# Patient Record
Sex: Female | Born: 1985 | Hispanic: Yes | Marital: Married | Smoking: Never smoker
Health system: Southern US, Community
[De-identification: ages and names within clinical notes are randomized; demographics above are authoritative.]

## PROBLEM LIST (undated history)

## (undated) ENCOUNTER — Emergency Department: Payer: BC Managed Care – PPO

## (undated) DIAGNOSIS — K900 Celiac disease: Secondary | ICD-10-CM

## (undated) DIAGNOSIS — R51 Headache: Secondary | ICD-10-CM

## (undated) DIAGNOSIS — K219 Gastro-esophageal reflux disease without esophagitis: Secondary | ICD-10-CM

## (undated) DIAGNOSIS — R519 Headache, unspecified: Secondary | ICD-10-CM

## (undated) DIAGNOSIS — M1990 Unspecified osteoarthritis, unspecified site: Secondary | ICD-10-CM

## (undated) DIAGNOSIS — H30132 Disseminated chorioretinal inflammation, generalized, left eye: Secondary | ICD-10-CM

## (undated) DIAGNOSIS — Z3041 Encounter for surveillance of contraceptive pills: Secondary | ICD-10-CM

## (undated) HISTORY — DX: Encounter for surveillance of contraceptive pills: Z30.41

## (undated) HISTORY — PX: WISDOM TOOTH EXTRACTION: SHX21

## (undated) HISTORY — DX: Gastro-esophageal reflux disease without esophagitis: K21.9

## (undated) HISTORY — DX: Unspecified osteoarthritis, unspecified site: M19.90

## (undated) HISTORY — DX: Headache, unspecified: R51.9

## (undated) HISTORY — DX: Headache: R51

## (undated) HISTORY — PX: RETINAL DETACHMENT SURGERY: SHX105

## (undated) HISTORY — PX: CATARACT EXTRACTION: SUR2

---

## 2008-10-03 ENCOUNTER — Emergency Department (HOSPITAL_COMMUNITY): Admission: EM | Admit: 2008-10-03 | Discharge: 2008-10-03 | Payer: Self-pay | Admitting: Emergency Medicine

## 2010-05-11 LAB — URINALYSIS, ROUTINE W REFLEX MICROSCOPIC
Hgb urine dipstick: NEGATIVE
Nitrite: NEGATIVE
Protein, ur: NEGATIVE mg/dL
Specific Gravity, Urine: 1.006 (ref 1.005–1.030)
Urobilinogen, UA: 0.2 mg/dL (ref 0.0–1.0)

## 2010-05-11 LAB — POCT PREGNANCY, URINE: Preg Test, Ur: NEGATIVE

## 2011-09-19 DIAGNOSIS — H30132 Disseminated chorioretinal inflammation, generalized, left eye: Secondary | ICD-10-CM | POA: Insufficient documentation

## 2011-09-19 DIAGNOSIS — H30039 Focal chorioretinal inflammation, peripheral, unspecified eye: Secondary | ICD-10-CM | POA: Insufficient documentation

## 2011-09-19 DIAGNOSIS — M0800 Unspecified juvenile rheumatoid arthritis of unspecified site: Secondary | ICD-10-CM | POA: Insufficient documentation

## 2015-06-27 DIAGNOSIS — Z5181 Encounter for therapeutic drug level monitoring: Secondary | ICD-10-CM | POA: Insufficient documentation

## 2016-07-02 DIAGNOSIS — N946 Dysmenorrhea, unspecified: Secondary | ICD-10-CM | POA: Insufficient documentation

## 2016-07-02 LAB — HM PAP SMEAR

## 2017-07-15 DIAGNOSIS — Z8669 Personal history of other diseases of the nervous system and sense organs: Secondary | ICD-10-CM | POA: Insufficient documentation

## 2018-01-06 ENCOUNTER — Ambulatory Visit: Payer: BC Managed Care – PPO | Admitting: Family Medicine

## 2018-01-06 ENCOUNTER — Ambulatory Visit (INDEPENDENT_AMBULATORY_CARE_PROVIDER_SITE_OTHER): Payer: BC Managed Care – PPO

## 2018-01-06 ENCOUNTER — Other Ambulatory Visit: Payer: Self-pay

## 2018-01-06 ENCOUNTER — Encounter: Payer: Self-pay | Admitting: Family Medicine

## 2018-01-06 VITALS — BP 110/72 | HR 76 | Temp 98.4°F | Ht 63.0 in | Wt 135.2 lb

## 2018-01-06 DIAGNOSIS — Z3041 Encounter for surveillance of contraceptive pills: Secondary | ICD-10-CM

## 2018-01-06 DIAGNOSIS — M79672 Pain in left foot: Secondary | ICD-10-CM | POA: Diagnosis not present

## 2018-01-06 DIAGNOSIS — M0800 Unspecified juvenile rheumatoid arthritis of unspecified site: Secondary | ICD-10-CM

## 2018-01-06 DIAGNOSIS — Z0000 Encounter for general adult medical examination without abnormal findings: Secondary | ICD-10-CM | POA: Diagnosis not present

## 2018-01-06 DIAGNOSIS — N946 Dysmenorrhea, unspecified: Secondary | ICD-10-CM | POA: Diagnosis not present

## 2018-01-06 DIAGNOSIS — H5440 Blindness, one eye, unspecified eye: Secondary | ICD-10-CM

## 2018-01-06 DIAGNOSIS — K9041 Non-celiac gluten sensitivity: Secondary | ICD-10-CM | POA: Insufficient documentation

## 2018-01-06 HISTORY — DX: Encounter for surveillance of contraceptive pills: Z30.41

## 2018-01-06 LAB — CBC WITH DIFFERENTIAL/PLATELET
Basophils Absolute: 0 10*3/uL (ref 0.0–0.1)
Basophils Relative: 0.6 % (ref 0.0–3.0)
EOS ABS: 0.3 10*3/uL (ref 0.0–0.7)
Eosinophils Relative: 6.5 % — ABNORMAL HIGH (ref 0.0–5.0)
HCT: 40.5 % (ref 36.0–46.0)
Hemoglobin: 13.9 g/dL (ref 12.0–15.0)
LYMPHS ABS: 1.6 10*3/uL (ref 0.7–4.0)
Lymphocytes Relative: 33.3 % (ref 12.0–46.0)
MCHC: 34.4 g/dL (ref 30.0–36.0)
MCV: 88.9 fl (ref 78.0–100.0)
MONO ABS: 0.3 10*3/uL (ref 0.1–1.0)
Monocytes Relative: 5.7 % (ref 3.0–12.0)
NEUTROS ABS: 2.5 10*3/uL (ref 1.4–7.7)
NEUTROS PCT: 53.9 % (ref 43.0–77.0)
PLATELETS: 198 10*3/uL (ref 150.0–400.0)
RBC: 4.55 Mil/uL (ref 3.87–5.11)
RDW: 12.8 % (ref 11.5–15.5)
WBC: 4.7 10*3/uL (ref 4.0–10.5)

## 2018-01-06 LAB — COMPREHENSIVE METABOLIC PANEL
ALT: 15 U/L (ref 0–35)
AST: 17 U/L (ref 0–37)
Albumin: 4.3 g/dL (ref 3.5–5.2)
Alkaline Phosphatase: 33 U/L — ABNORMAL LOW (ref 39–117)
BILIRUBIN TOTAL: 0.3 mg/dL (ref 0.2–1.2)
BUN: 8 mg/dL (ref 6–23)
CHLORIDE: 105 meq/L (ref 96–112)
CO2: 24 meq/L (ref 19–32)
CREATININE: 0.79 mg/dL (ref 0.40–1.20)
Calcium: 8.8 mg/dL (ref 8.4–10.5)
GFR: 88.92 mL/min (ref >60.00)
GLUCOSE: 89 mg/dL (ref 70–99)
Potassium: 3.5 mEq/L (ref 3.5–5.1)
Sodium: 138 mEq/L (ref 135–145)
Total Protein: 7.3 g/dL (ref 6.0–8.3)

## 2018-01-06 LAB — LIPID PANEL
CHOL/HDL RATIO: 4
Cholesterol: 213 mg/dL — ABNORMAL HIGH (ref 0–200)
HDL: 47.5 mg/dL (ref >39.00)
LDL CALC: 134 mg/dL — AB (ref 0–99)
NONHDL: 165.01
Triglycerides: 155 mg/dL — ABNORMAL HIGH (ref 0.0–149.0)
VLDL: 31 mg/dL (ref 0.0–40.0)

## 2018-01-06 NOTE — Patient Instructions (Signed)
Please return in 12 months for your annual complete physical; please come fasting.  Please go to our [facility] office to get your xrays done. You can walk in M-F between 8am and 5pm. Tell them you are there for xrays ordered by me. They will send me the results, then I will let you know the results with instructions.   Address: [address] [place], [place], [place] [phone]  (office sits at [address] at [address] intersection; from here, turn left onto [place] [address]), take to [address], turn right and go for a mile or so, office will be on left across form [address] )   It was a pleasure meeting you today! Thank you for choosing [place] to meet your healthcare needs! I truly look forward to working with you. If you have any questions or concerns, please send me a message via Mychart or call the office at [phone].  Please do these things to maintain good health!   Exercise at least 30-45 minutes a day,  4-5 days a week.   Eat a low-fat diet with lots of fruits and vegetables, up to 7-9 servings per day.  Drink plenty of water daily. Try to drink 8 8oz glasses per day.  Seatbelts can save your life. Always wear your seatbelt.  Place Smoke Detectors on every level of your home and check batteries every year.  Schedule an appointment with an eye doctor for an eye exam every 1-2 years  Safe sex - use condoms to protect yourself from STDs if you could be exposed to these types of infections. Use birth control if you do not want to become pregnant and are sexually active.  Avoid heavy alcohol use. If you drink, keep it to less than 2 drinks/day and not every day.  Health Care Power of Attorney.  Choose someone you trust that could speak for you if you became unable to speak for yourself.  Depression is common in our stressful world.If you're feeling down or losing interest in things you normally enjoy, please come in for a  visit.  If anyone is threatening or hurting you, please get help. Physical or Emotional Violence is never OK.

## 2018-01-06 NOTE — Progress Notes (Signed)
Subjective  Chief Complaint  Patient presents with  . Establish Care    Never had a PCP, Noelle for GYN, last visit was 07/17/2017  . Annual Exam    no complaints. HM up to date, Patient is fasting today   . Toe Pain    left Great Toe Pain    HPI: Darlene Montoya is a 32 y.o. female who presents to [facility] today for a Female Wellness Visit.   Wellness Visit: annual visit with health maintenance review and exam without Pap   32 year old G0 happily married female presents for annual exam.  She is never had a prior primary care doctor before.  She was diagnosed with juvenile rheumatoid arthritis at age 33, although that diagnosis is common to question since most of her symptoms have resolved on a gluten-free diet.  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  She sees a [facility] ophthalmologist every 6 months to manage her retinal disease.  She thus is skeptical about the diagnosis and further rheumatologic care.  Currently she has no joint pain.  She reportedly was rheumatoid negative.  She denies ever having been told she has bone changes consistent with rheumatoid arthritis and x-rays.  Follows a gluten-free diet and feels well.  Strongly sensitive to gluten with abdominal cramping, diarrhea and joint pain.  She has never been tested for celiac disease that she would never want to retry gluten.  About a year ago jammed her left great toe on a concrete curb while at the beach.  Urgent care evaluation was negative for fracture.  However since she continued to have pain in that joint.  Almost daily pain but mild.  Worse with standing for prolonged period of time.  Does not take anything for pain.  Chronic dysmenorrhea managed well with oral contraceptives since age 89.  No need for birth control.  Health maintenance: Pap smears are  normal and up-to-date.  Reports recent HIV testing.  Lives healthy lifestyle.  Assessment  1. Annual physical exam   2. 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 Left foot pain   4. Dysmenorrhea   5. JRA (juvenile rheumatoid arthritis) (HCC)   6. Gluten-sensitive enteropathy   7. Oral contraceptive use      Plan  Female Wellness Visit:  Age appropriate Health Maintenance and Prevention measures were discussed with patient. Included topics are cancer screening recommendations, ways to keep healthy (see AVS) including dietary and exercise recommendations, regular eye and dental care, use of seat belts, and avoidance of moderate alcohol use and tobacco use.   BMI: discussed patient's BMI and encouraged positive lifestyle modifications to help get to or maintain a target BMI.  HM needs and immunizations were addressed and ordered. See below for orders. See HM and immunization section for updates.  Routine labs and screening tests ordered including cmp, cbc and lipids where appropriate.  Discussed recommendations regarding Vit D and calcium supplementation (see AVS)  Left toe pain: May be due to posttraumatic arthritis or bunion.  Check x-rays, start NSAIDs and/or Tylenol.  Follow-up if not improving.  Consider sports medicine for orthotics.  Likely with celiac disease.  Continue gluten-free diet.  History of possible juvenile rheumatoid arthritis: In the future would consider tertiary care center referral for further evaluation and review.  Although, currently she is  doing very well.  Follow up: No follow-ups on file.   Orders Placed This Encounter  Procedures  . DG Foot Complete Left  . HM PAP SMEAR  . CBC with Differential/Platelet  . Comprehensive metabolic panel  . Lipid panel   No orders of the defined types were placed in this encounter.     Lifestyle: Body mass index is 23.95 kg/m. Wt Readings from Last 3 Encounters:  01/06/18 135 lb 3.2 oz  (61.3 kg)   Diet: low fat Exercise: frequently,  Need for contraception: No,  Social: Married to same-sex partner for 3 years.  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  Non-smoker  Patient Active Problem List   Diagnosis Date Noted  . 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 Gluten-sensitive enteropathy 01/06/2018    Clinical diagnosis   . Oral contraceptive use 01/06/2018    For dysmenorrhea and history of ruptured ovarian cyst   . History of retinal detachment 07/15/2017  . Dysmenorrhea 07/02/2016  . Therapeutic drug monitoring 06/27/2015  . 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 JRA (juvenile rheumatoid arthritis) (HCC) 09/19/2011    DX'D AGE 11.5 YRS.  STOPPED MEDS IN 2012 WHEN BECAME GLUTEN-FREE.   Marland Kitchen Retinal necrosis, acute, left 09/19/2011    S/P RD REPAIR 08/2010 S/P VTX WITH SO 09/2010 S/P CE WITH PCIOL 03/2011 S/P SO REMOVAL 07/21/11 S/P SO 09/08/11    Health Maintenance  Topic Date Due  . HIV Screening  09/17/1999  . PAP SMEAR  07/02/2021  . TETANUS/TDAP  07/05/2027  . INFLUENZA VACCINE  Completed   Immunization History  Administered Date(s) Administered  . HPV Quadrivalent 04/09/2009, 06/11/2009  . Influenza,inj,Quad PF,6+ Mos 12/14/2017  . Influenza-Unspecified 11/27/2015, 11/14/2016   We updated and reviewed the patient's past history in detail and it is documented below. Allergies: Patient is allergic to gluten meal; ketorolac; ketorolac tromethamine; nickel; and naproxen. Past Medical History Patient  has a past medical history of Arthritis, Frequent headaches, GERD (gastroesophageal reflux disease), and Oral contraceptive use (01/06/2018). Past Surgical History Patient  has a past surgical history that includes Wisdom tooth extraction; Retinal detachment surgery; and Cataract extraction. Family History: Patient family history includes Alzheimer's disease in her  paternal grandfather and paternal grandmother; Anxiety disorder in her brother and mother; Depression in her maternal grandmother and paternal grandmother; Diabetes in her brother, maternal grandfather, and maternal grandmother; Heart disease in her maternal grandfather; Hyperlipidemia in her father; Hypertension in her father; Leukemia in her maternal grandmother; Stroke in her maternal grandfather. Social History:  Patient  reports that she has never smoked. She has never used smokeless tobacco. She reports that she drank alcohol. She reports that she does not use drugs.  Review of Systems: Constitutional: negative for fever or malaise Ophthalmic: negative for photophobia, double vision or loss of vision Cardiovascular: negative for chest pain, dyspnea on exertion, or new LE swelling Respiratory: negative for SOB or persistent cough Gastrointestinal: negative for abdominal pain, change in bowel habits or melena Genitourinary: negative for dysuria or gross hematuria, no abnormal uterine bleeding or disharge Musculoskeletal: negative for new gait disturbance or muscular weakness Integumentary: negative for new or persistent rashes, no breast lumps Neurological: negative for TIA or stroke symptoms Psychiatric: negative for SI or delusions Allergic/Immunologic: negative for hives Patient Care Team    Relationship Specialty Notifications Start End  Willow Ora, MD PCP - General Family Medicine  01/06/18  Luciano Cutter, MD Consulting Physician Obstetrics and Gynecology  01/06/18     Objective  Vitals: BP 110/72   Pulse 76   Temp 98.4 F (36.9 C)   Ht 5\' 3"  (1.6 m)   Wt 135 lb 3.2 oz (61.3 kg)   LMP 12/23/2017   SpO2 99%   BMI 23.95 kg/m  General:  Well developed, well nourished, no acute distress  Psych:  Alert and orientedx3,normal mood and affect HEENT:  Normocephalic, atraumatic, non-icteric sclera, PERRL, oropharynx is clear without mass or exudate, supple neck without  adenopathy, mass or thyromegaly Cardiovascular:  Normal S1, S2, RRR without gallop, rub or murmur, nondisplaced PMI Respiratory:  Good breath sounds bilaterally, CTAB with normal respiratory effort Gastrointestinal: normal bowel sounds, soft, non-tender, no noted masses. No HSM MSK: no deformities, contusions. Joints are without erythema or swelling. Spine and CVA region are nontender, left foot normal-appearing, possible small bunion at first MTP.  No tenderness.  No erythema or warmth Skin:  Warm, no rashes or suspicious lesions noted Neurologic:    Mental status is normal. CN 2-11 are normal. Gross motor and sensory exams are normal. Normal gait. No tremor   Commons side effects, risks, benefits, and alternatives for medications and treatment plan prescribed today were discussed, and the patient expressed understanding of the given instructions. Patient is instructed to call or message via MyChart if he/she has any questions or concerns regarding our treatment plan. No barriers to understanding were identified. We discussed Red Flag symptoms and signs in detail. Patient expressed understanding regarding what to do in case of urgent or emergency type symptoms.   Medication list was reconciled, printed and provided to the patient in AVS. Patient instructions and summary information was reviewed with the patient as documented in the AVS. This note was prepared with assistance of Dragon voice recognition software. Occasional wrong-word or sound-a-like substitutions may have occurred due to the inherent limitations of voice recognition software

## 2018-01-07 ENCOUNTER — Encounter: Payer: Self-pay | Admitting: Family Medicine

## 2018-01-07 LAB — HIV ANTIBODY (ROUTINE TESTING W REFLEX): HIV: NONREACTIVE

## 2018-01-07 NOTE — Progress Notes (Signed)
Please call patient: I have reviewed her xray result: it confirms arthritis in the first toe as expected. She may try the therapies we discussed yesterday to help: supportive shoes, tylenol or advil. F/u if needed

## 2018-07-27 ENCOUNTER — Encounter: Payer: Self-pay | Admitting: Family Medicine

## 2019-01-10 ENCOUNTER — Encounter: Payer: BC Managed Care – PPO | Admitting: Family Medicine

## 2019-02-19 ENCOUNTER — Encounter: Payer: Self-pay | Admitting: Family Medicine

## 2020-04-27 ENCOUNTER — Encounter (HOSPITAL_COMMUNITY): Payer: Self-pay | Admitting: Emergency Medicine

## 2020-04-27 ENCOUNTER — Emergency Department (HOSPITAL_COMMUNITY)
Admission: EM | Admit: 2020-04-27 | Discharge: 2020-04-27 | Disposition: A | Discharge status: Home / Self Care | Payer: BC Managed Care – PPO | Attending: Emergency Medicine | Admitting: Emergency Medicine

## 2020-04-27 ENCOUNTER — Other Ambulatory Visit: Payer: Self-pay

## 2020-04-27 ENCOUNTER — Emergency Department (HOSPITAL_COMMUNITY): Payer: BC Managed Care – PPO

## 2020-04-27 DIAGNOSIS — R0789 Other chest pain: Secondary | ICD-10-CM | POA: Diagnosis present

## 2020-04-27 DIAGNOSIS — R0602 Shortness of breath: Secondary | ICD-10-CM | POA: Diagnosis not present

## 2020-04-27 DIAGNOSIS — R079 Chest pain, unspecified: Secondary | ICD-10-CM

## 2020-04-27 HISTORY — DX: Celiac disease: K90.0

## 2020-04-27 HISTORY — DX: Disseminated chorioretinal inflammation, generalized, left eye: H30.132

## 2020-04-27 LAB — CBC
HCT: 41.4 % (ref 36.0–46.0)
Hemoglobin: 14 g/dL (ref 12.0–15.0)
MCH: 30.7 pg (ref 26.0–34.0)
MCHC: 33.8 g/dL (ref 30.0–36.0)
MCV: 90.8 fL (ref 80.0–100.0)
Platelets: 179 10*3/uL (ref 150–400)
RBC: 4.56 MIL/uL (ref 3.87–5.11)
RDW: 12.7 % (ref 11.5–15.5)
WBC: 6.2 10*3/uL (ref 4.0–10.5)
nRBC: 0 % (ref 0.0–0.2)

## 2020-04-27 LAB — BASIC METABOLIC PANEL
Anion gap: 7 (ref 5–15)
BUN: 13 mg/dL (ref 6–20)
CO2: 24 mmol/L (ref 22–32)
Calcium: 9 mg/dL (ref 8.9–10.3)
Chloride: 104 mmol/L (ref 98–111)
Creatinine, Ser: 0.86 mg/dL (ref 0.44–1.00)
GFR, Estimated: >60 mL/min (ref >60)
Glucose, Bld: 87 mg/dL (ref 70–99)
Potassium: 3.7 mmol/L (ref 3.5–5.1)
Sodium: 135 mmol/L (ref 135–145)

## 2020-04-27 LAB — TROPONIN I (HIGH SENSITIVITY): Troponin I (High Sensitivity): <2 ng/L (ref <18)

## 2020-04-27 LAB — D-DIMER, QUANTITATIVE: D-Dimer, Quant: 0.28 ug/mL-FEU (ref 0.00–0.50)

## 2020-04-27 LAB — I-STAT BETA HCG BLOOD, ED (MC, WL, AP ONLY): I-stat hCG, quantitative: <5 m[IU]/mL (ref <5)

## 2020-04-27 NOTE — ED Provider Notes (Signed)
[facility] EMERGENCY DEPARTMENT Provider Note   CSN: [id number] Arrival date & time: 04/27/20  1749     History Chief Complaint  Patient presents with  . Chest Pain    Darlene Montoya is a 35 y.o. female.  Presented to ER with concern for chest pain.  Over the past few days patient has noted some intermittent chest discomfort.  States it occurs worse with deep breathing, also noted when going up steps.  Has felt slightly more short of breath than normal as well.  Currently at rest does not have any symptoms.  Went to urgent care and told to go to ER for further evaluation.  States that she has family history of coronary artery disease however neither of her parents have CAD.  Takes OCPs for dysmenorrhea.  Non-smoker.  Celiac disease, diet controlled rheumatoid arthritis.  No leg pain or leg swelling.  HPI     Past Medical History:  Diagnosis Date  . Acute retinal necrosis of left eye   . Arthritis   . Celiac disease   . Frequent headaches   . GERD (gastroesophageal reflux disease)   . Oral contraceptive use 01/06/2018   For dysmenorrhea and history of ruptured ovarian cyst    Patient Active Problem List   Diagnosis Date Noted  . 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 Gluten-sensitive enteropathy 01/06/2018  . Oral contraceptive use 01/06/2018  . History of retinal detachment 07/15/2017  . Dysmenorrhea 07/02/2016  . Therapeutic drug monitoring 06/27/2015  . 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 JRA (juvenile rheumatoid arthritis) (HCC) 09/19/2011  . Retinal necrosis, acute, left 09/19/2011    Past Surgical History:  Procedure Laterality Date  . CATARACT EXTRACTION    . RETINAL DETACHMENT SURGERY    . WISDOM TOOTH EXTRACTION       OB History   No obstetric history on file.     Family History  Problem Relation Age of Onset  . Anxiety disorder Mother   . Hyperlipidemia Father   .  Hypertension Father   . Anxiety disorder Brother   . Diabetes Brother   . Diabetes Maternal Grandmother   . Leukemia Maternal Grandmother   . Depression Maternal Grandmother   . Diabetes Maternal Grandfather   . Heart disease Maternal Grandfather   . Stroke Maternal Grandfather   . Depression Paternal Grandmother   . Alzheimer's disease Paternal Grandmother   . Alzheimer's disease Paternal Grandfather     Social History   Tobacco Use  . Smoking status: Never Smoker  . Smokeless tobacco: Never Used  Vaping Use  . Vaping Use: Never used  Substance Use Topics  . Alcohol use: Not Currently  . Drug use: Never    Home Medications Prior to Admission medications   Medication Sig Start Date End Date Taking? Authorizing Provider  Multiple Vitamins-Minerals (MULTIVITAMIN ADULT PO) Take by mouth.    [provider]  norgestrel-ethinyl estradiol (LOW-OGESTREL) 0.3-30 MG-MCG tablet  05/29/16   [provider]  valACYclovir (VALTREX) 500 MG tablet Take by mouth. 04/28/16   [provider]    Allergies    Gluten meal, Ketorolac, Ketorolac tromethamine, Nickel, and Naproxen  Review of Systems   Review of Systems  Constitutional: Negative for chills and fever.  HENT: Negative for ear pain and sore throat.   Eyes: Negative for pain and visual disturbance.  Respiratory: Positive for shortness of breath. Negative for cough.   Cardiovascular: Positive  for chest pain. Negative for palpitations.  Gastrointestinal: Negative for abdominal pain and vomiting.  Genitourinary: Negative for dysuria and hematuria.  Musculoskeletal: Negative for arthralgias and back pain.  Skin: Negative for color change and rash.  Neurological: Negative for seizures and syncope.  All other systems reviewed and are negative.   Physical Exam Updated Vital Signs BP 118/75   Pulse 77   Temp 98.8 F (37.1 C)   Resp 10   LMP  (Within Weeks)   SpO2 99%   Physical Exam Vitals and  nursing note reviewed.  Constitutional:      General: She is not in acute distress.    Appearance: She is well-developed.  HENT:     Head: Normocephalic and atraumatic.  Eyes:     Conjunctiva/sclera: Conjunctivae normal.  Cardiovascular:     Rate and Rhythm: Normal rate and regular rhythm.     Heart sounds: No murmur heard.   Pulmonary:     Effort: Pulmonary effort is normal. No respiratory distress.     Breath sounds: Normal breath sounds.  Abdominal:     Palpations: Abdomen is soft.     Tenderness: There is no abdominal tenderness.  Musculoskeletal:     Cervical back: Neck supple.  Skin:    General: Skin is warm and dry.     Capillary Refill: Capillary refill takes less than 2 seconds.  Neurological:     General: No focal deficit present.     Mental Status: She is alert.  Psychiatric:        Mood and Affect: Mood normal.        Behavior: Behavior normal.     ED Results / Procedures / Treatments   Labs (all labs ordered are listed, but only abnormal results are displayed) Labs Reviewed  BASIC METABOLIC PANEL  CBC  D-DIMER, QUANTITATIVE  I-STAT BETA HCG BLOOD, ED (MC, WL, AP ONLY)  TROPONIN I (HIGH SENSITIVITY)  TROPONIN I (HIGH SENSITIVITY)    EKG EKG Interpretation  Date/Time:  Friday April 27 2020 17:56:15 EDT Ventricular Rate:  80 PR Interval:  130 QRS Duration: 82 QT Interval:  354 QTC Calculation: 408 R Axis:   91 Text Interpretation: Normal sinus rhythm Rightward axis Borderline ECG Confirmed by Marianna Fuss ([id number]) on 04/27/2020 6:50:55 PM   Radiology DG Chest 2 View  Result Date: 04/27/2020 CLINICAL DATA:  Chest pain, persistent midsternal chest pain for 3 days exaggerated by inspiration and exertion EXAM: CHEST - 2 VIEW COMPARISON:  None FINDINGS: Normal heart size, mediastinal contours, and pulmonary vascularity. Lungs clear. No pleural effusion or pneumothorax. Bones unremarkable. IMPRESSION: Normal exam. Electronically Signed   By: Ulyses Southward M.D.   On: 04/27/2020 18:23    Procedures Procedures   Medications Ordered in ED Medications - No data to display  ED Course  I have reviewed the triage vital signs and the nursing notes.  Pertinent labs & imaging results that were available during my care of the patient were reviewed by me and considered in my medical decision making (see chart for details).    MDM Rules/Calculators/A&P                         35 year old who presented to ER with concern for chest pain and intermittent shortness of breath.  On physical exam she is remarkably well-appearing no distress.  EKG without acute ischemic change and troponin undetectable, doubt ACS.  D-dimer within normal limits, vital stable, doubt PE.  CXR negative.  Given no ongoing symptoms and stable vital signs and reassuring work-up, believe patient can be discharged and managed in the outpatient setting.  Suspect more likely MSK in etiology.  Recommended follow-up with PCP.  After the discussed management above, the patient was determined to be safe for discharge.  The patient was in agreement with this plan and all questions regarding their care were answered.  ED return precautions were discussed and the patient will return to the ED with any significant worsening of condition.   Final Clinical Impression(s) / ED Diagnoses Final diagnoses:  Chest pain, unspecified type    Rx / DC Orders ED Discharge Orders    None       Milagros Loll, MD 04/27/20 2036

## 2020-04-27 NOTE — ED Triage Notes (Signed)
Patient complains of persistent mid sternal chest pain for the last three days that is exacerbated by inspiration and exertion. Pain is described as dull when still but sharp with inspiration. Patient alert, oriented, and in no apparent distress at this time.

## 2020-04-27 NOTE — Discharge Instructions (Signed)
Follow-up with your primary doctor.  Take Tylenol or Motrin for pain.  Return if your pain worsens, you develop difficulty breathing or other new concerning symptom.

## 2020-07-06 ENCOUNTER — Ambulatory Visit: Payer: Self-pay

## 2020-07-06 ENCOUNTER — Other Ambulatory Visit: Payer: Self-pay

## 2020-07-06 ENCOUNTER — Ambulatory Visit: Payer: BC Managed Care – PPO | Admitting: Family Medicine

## 2020-07-06 ENCOUNTER — Encounter: Payer: Self-pay | Admitting: Family Medicine

## 2020-07-06 VITALS — BP 146/70 | Ht 62.0 in | Wt 135.0 lb

## 2020-07-06 DIAGNOSIS — S86111A Strain of other muscle(s) and tendon(s) of posterior muscle group at lower leg level, right leg, initial encounter: Secondary | ICD-10-CM

## 2020-07-06 DIAGNOSIS — M79661 Pain in right lower leg: Secondary | ICD-10-CM

## 2020-07-06 NOTE — Progress Notes (Signed)
[facility] Attending Note: I have seen and examined this patient with the medical student. I have  reviewed the history, physical examination, assessment and plan as documented in the medical student's note.  I agree with the medical student's note and findings, assessment and treatment plan as documented with the following additions or changes: Dr. Karen Chafe performed ultrasound.  Calf strain. Achilles intact. Conservative treatment with activity as toleratedonly. F/u 1 week. RICE  Mild elevation BP, likely due to injury and pain/anxiety. No intervention necessary at this time.

## 2020-07-06 NOTE — Progress Notes (Signed)
  Darlene Montoya - 35 y.o. female MRN [id number]  Date of birth: 1985-12-10  SUBJECTIVE:    CC: Right Calf Pain   Injured calf it one hour ago.  She was walking her dog when it pulled her down 3 staris in front of her apartment. She landed on her feet but felt her calf stretch and had pain and inability to walk on it. She denies any popping sound. The pain is at the medial portion of her calf. She has applied some ice to it and taken some ibuprofen which have helped with pain. She reports having a small calf strain in April that she has been nursing. She is able to move her foot but with pain. She denies any previous surgery to the leg.   Objective:  VS: BP:(!) 146/70  HR: bpm  TEMP: ( )  RESP:   HT:5\' 2"  (157.5 cm)   WT:135 lb (61.2 kg)  BMI:24.69 PHYSICAL EXAM:  Well appearing woman. A&Ox3. Lying on the exam table with leg propped up.   Right Lower Extremity:  Mildly swollen at the calf without ecchymosis.  TTP at the medial calf.  Pain with active plantar and dorsal flexion at the ankle. FROM at the knee without pain.  Negative Thompson test.   Right Calf Ultrasound: Medial gastrocnemius visualized at the musculotendinous junction with disorganization and areas of scattered hypoechogenicity most consistent with a grade 2 calf strain. Mild effusion overlying the musculature. Visualized the proximal portion of the calf with intact fibers. Good visualization of the achilles with intact fibers.  No achilles tendon defects.  ASSESSMENT & PLAN:  Right Calf Strain, Grade 2:  We have advised using ice and NSAIDs for pain relief as needed. She should elevate the leg and use a compression sleeve or sock to help reduce swelling. She was provided with a heel pad to unload some strain on the calf while walking. She should walk as tolerated but advised to avoid any strenuous activity.  She will follow up in 1 week. If improving, she can advance activity as tolerated and can start strengthening  exercise.   Aurelio Jew, MS4

## 2020-07-13 ENCOUNTER — Encounter: Payer: Self-pay | Admitting: Family Medicine

## 2020-07-13 ENCOUNTER — Ambulatory Visit: Payer: BC Managed Care – PPO | Admitting: Family Medicine

## 2020-07-13 ENCOUNTER — Other Ambulatory Visit: Payer: Self-pay

## 2020-07-13 DIAGNOSIS — M79661 Pain in right lower leg: Secondary | ICD-10-CM

## 2020-07-13 NOTE — Progress Notes (Signed)
  Darlene Montoya - 35 y.o. female MRN [id number]  Date of birth: 05/24/1985  SUBJECTIVE:    CC: Follow up calf strain  [name] feels as though she has been improving. She has doen minimal activity since the injury. She feels minimal to no pain with the compression sock and heel lift. Some pain without the compression sock. She has been icing it as needed. She has a trip to [place] in 4 weeks that she would like to be ready to hike for.   Objective:  VS: BP:110/70  HR: bpm  TEMP: ( )  RESP:   HT:5\' 2"  (157.5 cm)   WT:135 lb (61.2 kg)  BMI:24.69 PHYSICAL EXAM:  Well appearing woman in NAD. A&Ox3.  Right Lower Extremity: No effusion, ecchymosis or erythema.  Mildly TTP at the medial gastrocnemius. FROM in ankle plantar and dorsiflexion wihout pain.  Can stand without pain.  ASSESSMENT & PLAN:  Right Calf Strain: Her calf strain is improving appropriately. She will continue to be sore during activity but can do activity as tolerated. Only restrictions are jumping is not advised. We have provided calf rehabilitation exercises for her to do. She should ice the calf after activity. She can continue to wear the compression sock especially during activity. Wean out of heel lift. She should stop activity and return if she begins to feel increasing pain. We will see her again in 4 weeks before her trip.  Aurelio Jew, MS4

## 2020-07-13 NOTE — Progress Notes (Signed)
[facility] Attending Note: I have seen and examined this patient with the medical student. I have  reviewed the history, physical examination, assessment and plan as documented in the medical student's note.  I agree with the medical student's note and findings, assessment and treatment plan as documented with the following additions or changes:  Gastrocnemius muscle strain, improving nicely.  We will begin rehabilitation.  By the time she is ready for her trip to [place] in 4 weeks, I suspect she will be able to do any activity.  She is reassured by this.  We will see her back in 4 weeks.

## 2020-07-13 NOTE — Patient Instructions (Signed)
It was a pleasure seeing you today!  Your calf strain is resolving nicely. There will still be some soreness but you can do activities as advised. We have given you exercises to help the healing process. It will be helpful to ice after those exercises.   We will see you again in four weeks before your trip to [place].

## 2020-08-03 ENCOUNTER — Other Ambulatory Visit: Payer: Self-pay

## 2020-08-03 ENCOUNTER — Encounter: Payer: Self-pay | Admitting: Family Medicine

## 2020-08-03 ENCOUNTER — Ambulatory Visit: Payer: BC Managed Care – PPO | Admitting: Family Medicine

## 2020-08-03 VITALS — Ht 62.0 in | Wt 135.0 lb

## 2020-08-03 DIAGNOSIS — M79661 Pain in right lower leg: Secondary | ICD-10-CM | POA: Diagnosis not present

## 2020-08-03 NOTE — Progress Notes (Signed)
  Darlene Montoya - 35 y.o. female MRN [id number]  Date of birth: 1985-12-18    SUBJECTIVE:      Chief Complaint:/ HPI:    Date of injury July 06, 2020 Right calf strain.  She has been progressing nicely.  She is able to walk now without any pain.  She continues to wear the compression sleeve.  She is planning vacation to [place], leaving in the next week.  Has some really vigorous hiking planned.  The goal has been to get her ready for that.  She feels back to baseline but is concerned that one of the 1 mile hikes is started by mountain.  Wants to know if she has any restrictions.   OBJECTIVE: Ht 5\' 2"  (1.575 m)   Wt 135 lb (61.2 kg)   BMI 24.69 kg/m   Physical Exam:  Vital signs are reviewed. GENERAL: Well-developed female no acute distress CALVES: Bilateral are symmetrical.  Right calf is nontender.  There is no cord.  There is no defect.  She has normal strength in heel raise and an eccentric heel stretch combined with toe raise.  ASSESSMENT & PLAN: Right calf strain which I think is essentially healed.  She can return to normal activity.  I did recommend continuing to do some heel raises in gentle eccentric stretching daily for the next few weeks.  I do not think there are any limitations on her hiking however if she has pain, I recommend she take it easy.  Follow-up as needed. See problem based charting & AVS for pt instructions. No problem-specific Assessment & Plan notes found for this encounter.

## 2021-03-07 ENCOUNTER — Encounter: Payer: Self-pay | Admitting: Radiology

## 2021-03-19 ENCOUNTER — Encounter: Payer: Self-pay | Admitting: Radiology

## 2021-03-19 ENCOUNTER — Ambulatory Visit (INDEPENDENT_AMBULATORY_CARE_PROVIDER_SITE_OTHER): Payer: BC Managed Care – PPO | Admitting: Radiology

## 2021-03-19 ENCOUNTER — Other Ambulatory Visit: Payer: Self-pay

## 2021-03-19 ENCOUNTER — Other Ambulatory Visit (HOSPITAL_COMMUNITY)
Admission: RE | Admit: 2021-03-19 | Discharge: 2021-03-19 | Disposition: A | Discharge status: Home / Self Care | Payer: BC Managed Care – PPO | Source: Ambulatory Visit | Attending: Radiology | Admitting: Radiology

## 2021-03-19 VITALS — BP 120/74 | Ht 61.25 in | Wt 139.0 lb

## 2021-03-19 DIAGNOSIS — Z01419 Encounter for gynecological examination (general) (routine) without abnormal findings: Secondary | ICD-10-CM

## 2021-03-19 DIAGNOSIS — M069 Rheumatoid arthritis, unspecified: Secondary | ICD-10-CM | POA: Insufficient documentation

## 2021-03-19 MED ORDER — NORETHIN ACE-ETH ESTRAD-FE 1-20 MG-MCG PO TABS: 1.0000 | ORAL_TABLET | Freq: Every day | ORAL | 4 refills | Status: DC

## 2021-03-19 NOTE — Progress Notes (Signed)
° °  Darlene Montoya 08-31-1985 [id number]   History:  36 y.o., married G0 presents for annual exam. Currently on OCPs, hx of ovarian cyst rupture, considering going off OCPs to decrease med intake.   Gynecologic History Patient's last menstrual period was 03/11/2021. Period Cycle (Days): 28 Period Duration (Days): 5 Period Pattern: Regular Menstrual Flow: Moderate Dysmenorrhea: (!) Mild Dysmenorrhea Symptoms: Cramping Contraception/Family planning: OCP (estrogen/progesterone) Sexually active: yes, monogamous with wife Last Pap: 2020. Results were: normal     The following portions of the patient's history were reviewed and updated as appropriate: allergies, current medications, past family history, past medical history, past social history, past surgical history, and problem list.  Review of Systems Pertinent items noted in HPI and remainder of comprehensive ROS otherwise negative.   Past medical history, past surgical history, family history and social history were all reviewed and documented in the EPIC chart.  ROS:  A ROS was performed and pertinent positives and negatives are included.  Exam:  Vitals:   03/19/21 1411  BP: 120/74  Weight: 139 lb (63 kg)  Height: 5' 1.25" (1.556 m)   Body mass index is 26.05 kg/m.  General appearance:  Normal Thyroid:  Symmetrical, normal in size, without palpable masses or nodularity. Respiratory  Auscultation:  Clear without wheezing or rhonchi Cardiovascular  Auscultation:  Regular rate, without rubs, murmurs or gallops  Edema/varicosities:  Not grossly evident Abdominal  Soft,nontender, without masses, guarding or rebound.  Liver/spleen:  No organomegaly noted  Hernia:  None appreciated  Skin  Inspection:  Grossly normal Breasts: Examined lying and sitting.   Right: Without masses, retractions, nipple discharge or axillary adenopathy.   Left: Without masses, retractions, nipple discharge or axillary adenopathy. Genitourinary    Inguinal/mons:  Normal without inguinal adenopathy  External genitalia:  Normal appearing vulva with no masses, tenderness, or lesions  BUS/Urethra/Skene's glands:  Normal without masses or exudate  Vagina:  Normal appearing with normal color and discharge, no lesions  Cervix:  Normal appearing without discharge or lesions  Uterus:  Normal in size, shape and contour.  Mobile, nontender  Adnexa/parametria:     Rt: Normal in size, without masses or tenderness.   Lt: Normal in size, without masses or tenderness.  Anus and perineum: Normal  Digital rectal exam: Normal sphincter tone without palpated masses or tenderness  Patient informed chaperone available to be present for breast and pelvic exam. Patient has requested no chaperone to be present. Patient has been advised what will be completed during breast and pelvic exam.   Assessment/Plan:   -Well woman exam -Pap with cotesting -Discussed switching to a lower dose OCP vs stopping. Pt is open to trying a lower dose to control cycle. Rx for Junel sent, will begin with next cycle.  Discussed SBE, pap screening as directed/appropriate. Recommend of exercise weekly, including weight bearing exercise. Encouraged the use of seatbelts and sunscreen. Return in 1 year for annual or as needed.   Arlie Solomons B WHNP-BC 2:41 PM 03/19/2021

## 2021-03-21 LAB — CYTOLOGY - PAP
Adequacy: ABSENT
Comment: NEGATIVE
Diagnosis: NEGATIVE
High risk HPV: NEGATIVE

## 2021-08-07 IMAGING — DX DG CHEST 2V
2 series · 2 of 2 positions shown · non-contrast
Comparison: None

CLINICAL DATA: Chest pain, persistent midsternal chest pain for 3
days exaggerated by inspiration and exertion

EXAM:
CHEST - 2 VIEW

[chest pa]
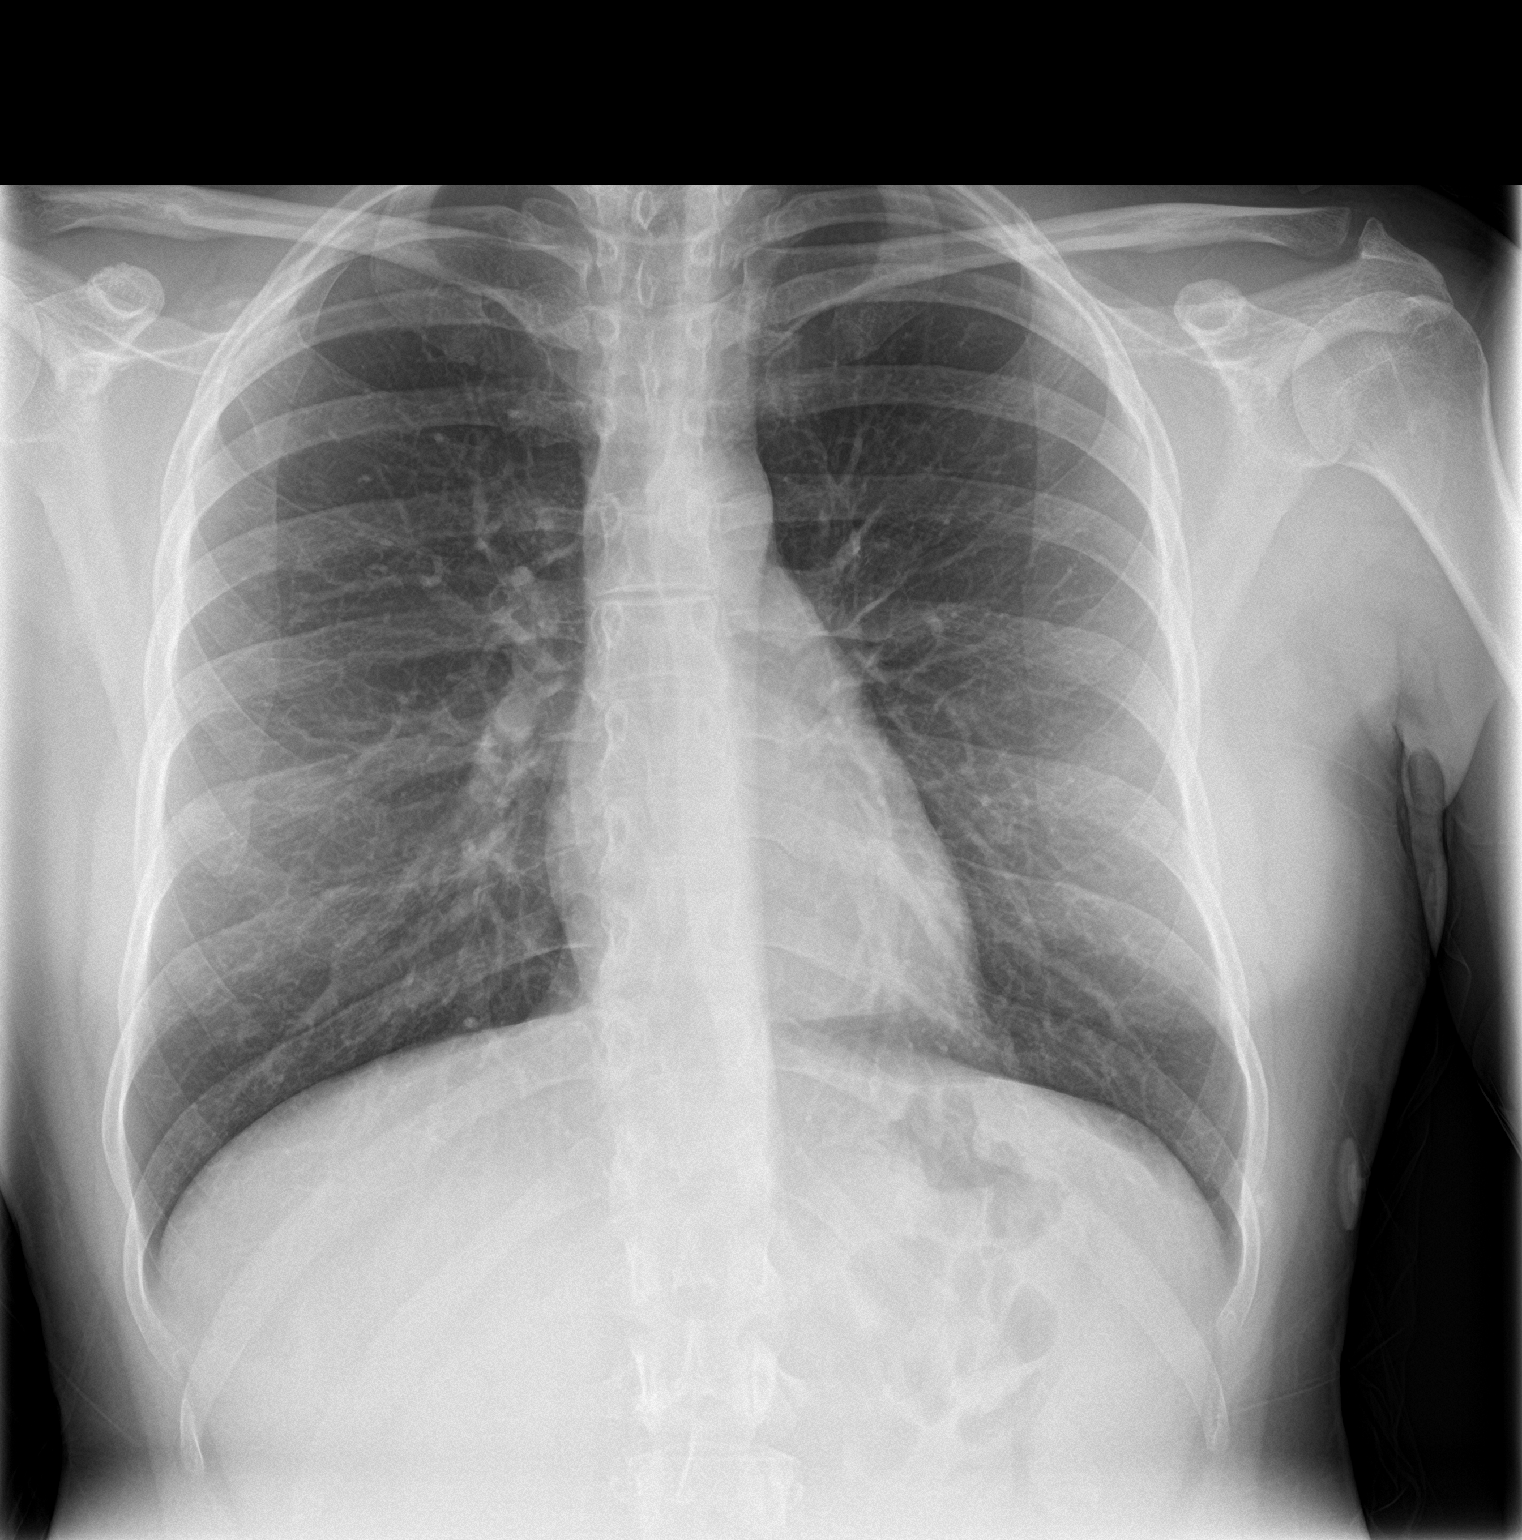

[chest lat]
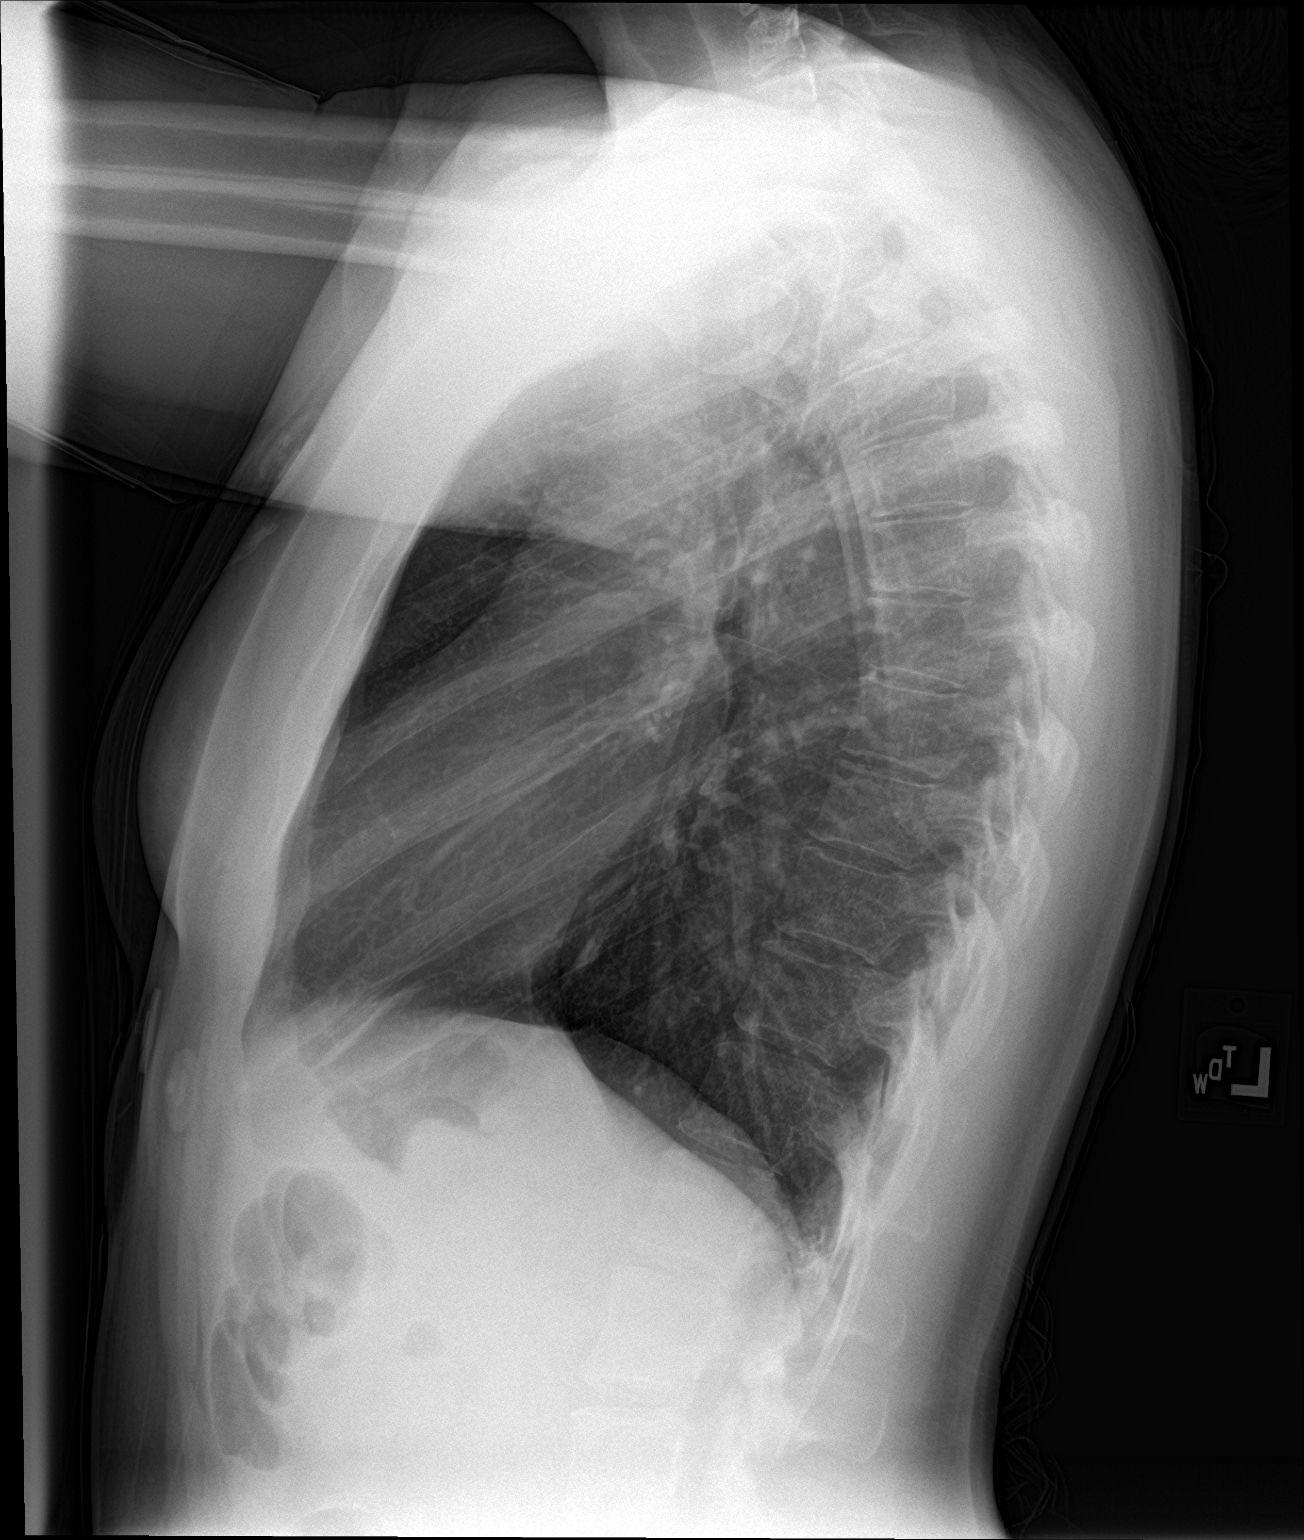

[2 of 2 positions shown; findings below may reference images not displayed]

FINDINGS: Normal heart size, mediastinal contours, and pulmonary vascularity.

Lungs clear.

No pleural effusion or pneumothorax.

Bones unremarkable.
IMPRESSION: Normal exam.

## 2022-01-10 ENCOUNTER — Encounter: Payer: Self-pay | Admitting: Family Medicine

## 2022-01-10 ENCOUNTER — Ambulatory Visit: Payer: Self-pay

## 2022-01-10 ENCOUNTER — Ambulatory Visit: Payer: BC Managed Care – PPO | Admitting: Family Medicine

## 2022-01-10 VITALS — BP 102/66 | Ht 62.0 in | Wt 135.0 lb

## 2022-01-10 DIAGNOSIS — M79671 Pain in right foot: Secondary | ICD-10-CM

## 2022-01-10 NOTE — Progress Notes (Signed)
  Darlene Montoya - 36 y.o. female MRN [id number]  Date of birth: [birth year]    SUBJECTIVE:      Chief Complaint:/ HPI:    2 to 3 months of right heel pain.  Avid runner and she is for started noticing this during and after rounds.  Now its present most of the time.  On the plantar surface of the heel.  Worse with standing.  For step in the morning is painful.  No change in running shoes.  She does run on pavement or sidewalk, 2 to 3 miles a day.  She has been wearing some Birkenstock sandals which are the only thing that are comfortable for her right now.   OBJECTIVE: BP 102/66   Ht 5\' 2"  (1.575 m)   Wt 135 lb (61.2 kg)   BMI 24.69 kg/m   Physical Exam:  Vital signs are reviewed. GENERAL: Well-developed female no acute distress feet: Bilaterally she has mild pes planus perhaps a little worse on the right.  Right heel tender to palpation over the origin of the plantar fascia and this reproduces her pain. ULTRASOUND: Significant amount of fluid around the origin of the plantar fascia seen on the right foot.  The plantar fascia itself is without defect.  Calcaneus is without any sign of cortical defect.  ASSESSMENT & PLAN: #1.  Plan fasciitis right foot: Sports insole with gray scaphoid pad to be used in running shoes and  in her everyday shoes.  I would wear the Birkenstocks is much as possible when not using inserts as they will relieve the stress on this area.  Home exercise program. Significant focus on the ice/rolling exercise of the home exercise program.   Perhaps alternate running and yoga.  Follow-up 3 weeks or so.  If not having some improvement, would consider corticosteroid injection.  She might also benefit long-term from custom molded insoles.  See problem based charting & AVS for pt instructions. No problem-specific Assessment & Plan notes found for this encounter.

## 2022-03-12 ENCOUNTER — Other Ambulatory Visit: Payer: Self-pay | Admitting: Radiology

## 2022-03-12 DIAGNOSIS — Z309 Encounter for contraceptive management, unspecified: Secondary | ICD-10-CM

## 2022-03-12 NOTE — Telephone Encounter (Signed)
RF request received for Bilsovi Fe 1/20.  Last AEX 03/19/21.  AEX apt. Is scheduled 04/01/22.  RF sent.

## 2022-03-20 ENCOUNTER — Ambulatory Visit: Payer: BC Managed Care – PPO | Admitting: Radiology

## 2022-04-01 ENCOUNTER — Ambulatory Visit (INDEPENDENT_AMBULATORY_CARE_PROVIDER_SITE_OTHER): Payer: BC Managed Care – PPO | Admitting: Radiology

## 2022-04-01 ENCOUNTER — Encounter: Payer: Self-pay | Admitting: Radiology

## 2022-04-01 VITALS — BP 116/78 | Ht 61.5 in | Wt 139.0 lb

## 2022-04-01 DIAGNOSIS — Z01419 Encounter for gynecological examination (general) (routine) without abnormal findings: Secondary | ICD-10-CM

## 2022-04-01 DIAGNOSIS — Z309 Encounter for contraceptive management, unspecified: Secondary | ICD-10-CM

## 2022-04-01 MED ORDER — NORETHIN ACE-ETH ESTRAD-FE 1-20 MG-MCG PO TABS: 1.0000 | ORAL_TABLET | Freq: Every day | ORAL | 4 refills | Status: DC

## 2022-04-01 NOTE — Progress Notes (Signed)
   Darlene Montoya November 21, 1985 [id number]   History:  37 y.o. G0 presents for annual exam. Doing well, no gyn concerns.   Gynecologic History Patient's last menstrual period was 03/10/2022 (exact date). Period Cycle (Days): 28 Period Duration (Days): 3 Period Pattern: Regular Menstrual Flow: Light Menstrual Control: Maxi pad Dysmenorrhea: (!) Mild Dysmenorrhea Symptoms: Cramping Contraception/Family planning: OCP (estrogen/progesterone)/ female partner Sexually active: yes Last Pap: 03/2021. Results were: normal   Obstetric History OB History  Gravida Para Term Preterm AB Living  0 0 0 0 0 0  SAB IAB Ectopic Multiple Live Births  0 0 0 0 0     The following portions of the patient's history were reviewed and updated as appropriate: allergies, current medications, past family history, past medical history, past social history, past surgical history, and problem list.  Review of Systems Pertinent items noted in HPI and remainder of comprehensive ROS otherwise negative.   Past medical history, past surgical history, family history and social history were all reviewed and documented in the EPIC chart.   Exam:  Vitals:   04/01/22 1456  BP: 116/78  Weight: 139 lb (63 kg)  Height: 5' 1.5" (1.562 m)   Body mass index is 25.84 kg/m.  General appearance:  Normal Respiratory  Auscultation:  Clear without wheezing or rhonchi Cardiovascular  Auscultation:  Regular rate, without rubs, murmurs or gallops  Edema/varicosities:  Not grossly evident Abdominal  Soft,nontender, without masses, guarding or rebound.  Liver/spleen:  No organomegaly noted  Hernia:  None appreciated  Skin  Inspection:  Grossly normal Breasts: Examined lying and sitting.   Right: Without masses, retractions, nipple discharge or axillary adenopathy.   Left: Without masses, retractions, nipple discharge or axillary adenopathy. Genitourinary   Inguinal/mons:  Normal without inguinal adenopathy  External  genitalia:  Normal appearing vulva with no masses, tenderness, or lesions  BUS/Urethra/Skene's glands:  Normal without masses or exudate  Vagina:  Normal appearing with normal color and discharge, no lesions  Cervix:  Normal appearing without discharge or lesions  Uterus:  Normal in size, shape and contour.  Mobile, nontender  Adnexa/parametria:     Rt: Normal in size, without masses or tenderness.   Lt: Normal in size, without masses or tenderness.  Anus and perineum: Normal   Patient informed chaperone available to be present for breast and pelvic exam. Patient has requested no chaperone to be present. Patient has been advised what will be completed during breast and pelvic exam.   Assessment/Plan:   1. Well woman exam with routine gynecological exam Pap due 2026  2. Encounter for contraceptive management, unspecified type  - norethindrone-ethinyl estradiol-FE (BLISOVI FE 1/20) 1-20 MG-MCG tablet; Take 1 tablet by mouth daily.  Dispense: 84 tablet; Refill: 4     Discussed SBE, colonoscopy and DEXA screening as directed/appropriate. Recommend 165mns of exercise weekly, including weight bearing exercise. Encouraged the use of seatbelts and sunscreen. Return in 1 year for annual or as needed.   CRubbie BattiestB WHNP-BC 3:27 PM 04/01/2022

## 2022-09-01 ENCOUNTER — Encounter: Payer: Self-pay | Admitting: Dermatology

## 2022-09-01 ENCOUNTER — Other Ambulatory Visit: Payer: Self-pay | Admitting: Dermatology

## 2022-09-01 ENCOUNTER — Ambulatory Visit (INDEPENDENT_AMBULATORY_CARE_PROVIDER_SITE_OTHER): Payer: BC Managed Care – PPO | Admitting: Dermatology

## 2022-09-01 VITALS — BP 119/77 | HR 71

## 2022-09-01 DIAGNOSIS — L719 Rosacea, unspecified: Secondary | ICD-10-CM | POA: Diagnosis not present

## 2022-09-01 DIAGNOSIS — L219 Seborrheic dermatitis, unspecified: Secondary | ICD-10-CM

## 2022-09-01 MED ORDER — ZORYVE 0.3 % EX FOAM: CUTANEOUS | 2 refills | Status: AC

## 2022-09-01 MED ORDER — ZORYVE 0.3 % EX FOAM: 1.0000 "application " | Freq: Every morning | CUTANEOUS | 2 refills | Status: AC

## 2022-09-01 NOTE — Patient Instructions (Addendum)
Hello Darlene Montoya,    Thank you for visiting my office today. I appreciate your dedication to improving your health and managing your skin condition. Here is a summary of our discussion and the treatment plan we have outlined:  - Skin Care Routine Adjustments:   - Continue using jojoba, rosehip, and almond oil blend on your face and neck, avoiding the nose area to prevent aggravating seborrheic dermatitis.   - Introduce Avene AntiRougers products (Facial Cleanser and Daily Moisturizer) to your routine. Start using these products exclusively for two to three weeks to assess their effectiveness.   - If additional hydration is needed after this period, you may reintroduce oils cautiously.  - Medication for Flares:   - Zoryve foam for flare-ups of seborrheic dermatitis, applied as directed. If insurance coverage is an issue, we may switch to Pimecrolimus cream.   - Use zinc-based shampoos and selenium wash as preventive measures against flares.  - Diet and Lifestyle:   - Continue avoiding dairy and gluten as these have shown improvements in your symptoms.   - Monitor and possibly reduce other potential triggers like emotional stress and overheating.  - Follow-Up Appointment:   - We will schedule a follow-up in three months to evaluate the effectiveness of the new regimen and make any necessary adjustments.  Please feel free to reach out if you have any questions or concerns about this treatment plan. Looking forward to seeing the progress at your next appointment.  Best,  Dr. Onalee Hua      Due to recent changes in healthcare laws, you may see results of your pathology and/or laboratory studies on MyChart before the doctors have had a chance to review them. We understand that in some cases there may be results that are confusing or concerning to you. Please understand that not all results are received at the same time and often the doctors may need to interpret multiple results in order to provide  you with the best plan of care or course of treatment. Therefore, we ask that you please give [place] 2 business days to thoroughly review all your results before contacting the office for clarification. Should we see a critical lab result, you will be contacted sooner.   If You Need Anything After Your Visit  If you have any questions or concerns for your doctor, please call our main line at [phone] If no one answers, please leave a voicemail as directed and we will return your call as soon as possible. Messages left after 4 pm will be answered the following business day.   You may also send [place] a message via MyChart. We typically respond to MyChart messages within 1-2 business days.  For prescription refills, please ask your pharmacy to contact our office. Our fax number is [phone].  If you have an urgent issue when the clinic is closed that cannot wait until the next business day, you can page your doctor at the number below.    Please note that while we do our best to be available for urgent issues outside of office hours, we are not available 24/7.   If you have an urgent issue and are unable to reach [place], you may choose to seek medical care at your doctor's office, retail clinic, urgent care center, or emergency room.  If you have a medical emergency, please immediately call 911 or go to the emergency department. In the event of inclement weather, please call our main line at [phone] for an update on the  status of any delays or closures.  Dermatology Medication Tips: Please keep the boxes that topical medications come in in order to help keep track of the instructions about where and how to use these. Pharmacies typically print the medication instructions only on the boxes and not directly on the medication tubes.   If your medication is too expensive, please contact our office at [phone] or send [place] a message through MyChart.   We are unable to tell what your co-pay for  medications will be in advance as this is different depending on your insurance coverage. However, we may be able to find a substitute medication at lower cost or fill out paperwork to get insurance to cover a needed medication.   If a prior authorization is required to get your medication covered by your insurance company, please allow [place] 1-2 business days to complete this process.  Drug prices often vary depending on where the prescription is filled and some pharmacies may offer cheaper prices.  The website www.goodrx.com contains coupons for medications through different pharmacies. The prices here do not account for what the cost may be with help from insurance (it may be cheaper with your insurance), but the website can give you the price if you did not use any insurance.  - You can print the associated coupon and take it with your prescription to the pharmacy.  - You may also stop by our office during regular business hours and pick up a GoodRx coupon card.  - If you need your prescription sent electronically to a different pharmacy, notify our office through [facility] or by phone at [phone]

## 2022-09-01 NOTE — Progress Notes (Signed)
New Patient Visit   Subjective  Darlene Montoya is a 37 y.o. female who presents for the following: redness on face   Patient states she has redness located at the face that she would like to have examined. Patient reports the areas have been there for for years. She reports the areas are bothersome. She states that she has scaliness and pealing around her nose. Patient reports it has previously been treated for these areas and diagnosed as having dermatitis . She was given topical steroids which actually made things worse. She uses Selsun blue to wash her face once a day and that has helped calm things. She uses Oil of Olay w vitamin C and jojoba oil  and almond and rosewood oil mix each just once a day. She has noticed that dairy seems to make it worse. She does uses sunscreen daily.Patient has no Hx of skin cancer. Patient has no family history of skin cancer(s).  She does have autoimmune condition.  The following portions of the chart were reviewed this encounter and updated as appropriate: medications, allergies, medical history  Review of Systems:  No other skin or systemic complaints except as noted in HPI or Assessment and Plan.  Objective  Well appearing patient in no apparent distress; mood and affect are within normal limits.  .  A focused examination was performed of the following areas: face  Relevant exam findings are noted in the Assessment and Plan.  Exam: Pink patches with greasy scale around nose  Exam Mid face erythema with telangiectasias +/- scattered inflammatory papules         Assessment & Plan    SEBORRHEIC DERMATITIS    flared   Seborrheic Dermatitis is a chronic persistent rash characterized by pinkness and scaling most commonly of the mid face but also can occur on the scalp (dandruff), ears; mid chest, mid back and groin.  It tends to be exacerbated by stress and cooler weather.  People who have neurologic disease may experience new onset or  exacerbation of existing seborrheic dermatitis.  The condition is not curable but treatable and can be controlled.  Treatment Plan:   - Continue using jojoba, rosehip, and almond oil blend on your face and neck, avoiding the nose area to prevent aggravating seborrheic dermatitis.  -Zoryve 0.3% foam to face daily for flares (recalcitrant dermatitis failed topical steroids)  - Use zinc-based shampoos and selenium wash as preventive measures against flares.  - Diet and Lifestyle:   - Continue avoiding dairy and gluten as these have shown improvements in your symptoms.   - Monitor and possibly reduce other potential triggers like emotional stress and overheating.    ROSACEA   Well controlled vs not at goal vs flared  Rosacea is a chronic progressive skin condition usually affecting the face of adults, causing redness and/or acne bumps. It is treatable but not curable. It sometimes affects the eyes (ocular rosacea) as well. It may respond to topical and/or systemic medication and can flare with stress, sun exposure, alcohol, exercise, topical steroids (including hydrocortisone/cortisone 10) and some foods.  Daily application of broad spectrum spf 30+ sunscreen to face is recommended to reduce flares.  Patient denies grittiness of the eyes  Treatment Plan  - Introduce Avene AntiRougers products (Facial Cleanser and Daily Moisturizer) to your routine. Start using these products exclusively for two to three weeks to assess their effectiveness.   - If additional hydration is needed after this period, you may reintroduce oils cautiously.  Return in about 3 months (around 12/02/2022) for Rosacea f/u.  Owens Shark, CMA, am acting as scribe for [employer], DO.   Documentation: I have reviewed the above documentation for accuracy and completeness, and I agree with the above.  Langston Reusing, DO

## 2022-12-02 ENCOUNTER — Ambulatory Visit: Payer: BC Managed Care – PPO | Admitting: Dermatology

## 2023-02-09 ENCOUNTER — Encounter: Payer: Self-pay | Admitting: Dermatology

## 2023-02-09 ENCOUNTER — Ambulatory Visit: Payer: 59 | Admitting: Dermatology

## 2023-02-09 VITALS — BP 120/76 | HR 76

## 2023-02-09 DIAGNOSIS — L719 Rosacea, unspecified: Secondary | ICD-10-CM | POA: Diagnosis not present

## 2023-02-09 DIAGNOSIS — L219 Seborrheic dermatitis, unspecified: Secondary | ICD-10-CM | POA: Diagnosis not present

## 2023-02-09 DIAGNOSIS — R238 Other skin changes: Secondary | ICD-10-CM

## 2023-02-09 NOTE — Patient Instructions (Addendum)
 Hello Darlene Montoya,  Thank you for visiting my office today. Your dedication to managing your dermatological health is greatly appreciated. Below is a summary of our discussion along with the key instructions for your care:  - Seborrheic Dermatitis Management: Continue avoiding oils as this has been beneficial for your stomach dermatitis.  - Zoryve  Foam: Keep this medication on hand for potential flare-ups of dermatitis.  - Selenium Sulfide Wash: Use as discussed for your specific condition.  - Rosacea Triggers: Monitor potential triggers such as wine and spicy foods to manage your rosacea effectively.  - Laser Treatment: Consider this option for more permanent redness reduction. Available at [facility] and our plastic surgery department, with an approximate cost of $300 per session.  - Aquaphor Application: Apply daily to the spot on your nose for the next 3 weeks and monitor for changes. Contact us  for a possible early appointment if the spot persists.  - Sunscreen: Use daily to help prevent rosacea from worsening.  - Follow-Up: Schedule an appointment in 6 months to check on your rosacea and seborrheic dermatitis progress.  Please do not hesitate to reach out if you have any questions or need further assistance. Wishing you a healthy and happy new year!  Warm regards,  Dr. Delon Lenis Dermatology      BBL or IPL laser for redness (4-6 sessions) [facility] or [facility]  Important Information   Due to recent changes in healthcare laws, you may see results of your pathology and/or laboratory studies on MyChart before the doctors have had a chance to review them. We understand that in some cases there may be results that are confusing or concerning to you. Please understand that not all results are received at the same time and often the doctors may need to interpret multiple results in order to provide you with the best plan of care or course of  treatment. Therefore, we ask that you please give us  2 business days to thoroughly review all your results before contacting the office for clarification. Should we see a critical lab result, you will be contacted sooner.     If You Need Anything After Your Visit   If you have any questions or concerns for your doctor, please call our main line at [phone]. If no one answers, please leave a voicemail as directed and we will return your call as soon as possible. Messages left after 4 pm will be answered the following business day.    You may also send us  a message via MyChart. We typically respond to MyChart messages within 1-2 business days.  For prescription refills, please ask your pharmacy to contact our office. Our fax number is [phone].  If you have an urgent issue when the clinic is closed that cannot wait until the next business day, you can page your doctor at the number below.     Please note that while we do our best to be available for urgent issues outside of office hours, we are not available 24/7.    If you have an urgent issue and are unable to reach us , you may choose to seek medical care at your doctor's office, retail clinic, urgent care center, or emergency room.   If you have a medical emergency, please immediately call 911 or go to the emergency department. In the event of inclement weather, please call our main line at [phone] for an update on the status of any delays or closures.  Dermatology Medication  Tips: Please keep the boxes that topical medications come in in order to help keep track of the instructions about where and how to use these. Pharmacies typically print the medication instructions only on the boxes and not directly on the medication tubes.   If your medication is too expensive, please contact our office at [phone] or send us  a message through MyChart.    We are unable to tell what your co-pay for medications will be in advance as  this is different depending on your insurance coverage. However, we may be able to find a substitute medication at lower cost or fill out paperwork to get insurance to cover a needed medication.    If a prior authorization is required to get your medication covered by your insurance company, please allow us  1-2 business days to complete this process.   Drug prices often vary depending on where the prescription is filled and some pharmacies may offer cheaper prices.   The website www.goodrx.com contains coupons for medications through different pharmacies. The prices here do not account for what the cost may be with help from insurance (it may be cheaper with your insurance), but the website can give you the price if you did not use any insurance.  - You can print the associated coupon and take it with your prescription to the pharmacy.  - You may also stop by our office during regular business hours and pick up a GoodRx coupon card.  - If you need your prescription sent electronically to a different pharmacy, notify our office through [facility] or by phone at [phone]

## 2023-02-09 NOTE — Progress Notes (Signed)
   Follow-Up Visit   Subjective  Latrelle Fuston is a 38 y.o. female who presents for the following: rosacea Pt here for follow up to rosacea that she was seen for on 09/01/22. Pt has been using Avene anti redness (redness expert) moisturizing cream and tolerene extremely gentle cleanser at night. She uses selsun blue once a day in the shower as a cleanser. She states she doesn't feel irritated any more but doesn't think redness has resolved. Since last visit pt stopped all essential oils and her seborrheic dermatitis has cleared.   The following portions of the chart were reviewed this encounter and updated as appropriate: medications, allergies, medical history  Review of Systems:  No other skin or systemic complaints except as noted in HPI or Assessment and Plan.  Objective  Well appearing patient in no apparent distress; mood and affect are within normal limits.  A focused examination was performed of the following areas: face  Relevant exam findings are noted in the Assessment and Plan.          Assessment & Plan   1. Rosacea - Assessment: Patient's rosacea is well-controlled without prescription medications. Skin appearance is good. Known triggers include exercise and possibly spicy food. Denies alcohol consumption. - Plan:    - Continue current management without prescription medications.   - Avoid known triggers such as wine and spicy foods.   - Consider laser treatment (BBL or IPL) at [facility] or plastic surgery department for a more permanent solution (4-6 sessions, ~$300 per session).   - Option for compounded metronidazole and brimonidine cream for redness ($40, not covered by insurance).   - Daily sunscreen use to prevent worsening of rosacea.   - Follow-up in 6 months to reassess rosacea.  2. Seborrheic Dermatitis - Assessment: Improvement in seborrheic dermatitis on the stomach area after discontinuing oil use. Condition is well-controlled. Selenium  sulfide wash has been beneficial. - Plan:   - Continue current management without oils.   - Keep Zoryve  Foam on hand for potential flare-ups.   - Continue using selenium sulfide wash as needed.   - Follow-up in 6 months to reassess seborrheic dermatitis.  3. Facial Lesion - Assessment: Persistent lesion on the nose for about 2 months, bleeding easily when scratched. Observation of excoriation and blood vessels at the base, suggesting a possible fibrous papule. Differential diagnosis includes skin cancer, considering family history.  Lesion is current excoriated so bx results may not be reilable - Plan:   - Apply Aquaphor to the lesion daily for 3 weeks.   - Monitor for healing or recurrence.   - If lesion persists or recurs, schedule a follow-up appointment for potential biopsy or further evaluation.   - Patient to call if concerns arise before the next scheduled appointment.   Return in about 6 months (around 08/09/2023) for Rosacea f/u.  LILLETTE Darice Smock, CMA, am acting as scribe for [employer], DO.   Documentation: I have reviewed the above documentation for accuracy and completeness, and I agree with the above.  Delon Lenis, DO

## 2023-05-03 ENCOUNTER — Other Ambulatory Visit: Payer: Self-pay | Admitting: Radiology

## 2023-05-03 DIAGNOSIS — Z309 Encounter for contraceptive management, unspecified: Secondary | ICD-10-CM

## 2023-05-04 NOTE — Telephone Encounter (Signed)
 Medication refill request: blisovi fe 1/20 Last AEX:  04-01-22 Next AEX: not scheduled. Message sent to scheduling department to call her to schedule aex Last MMG (if hormonal medication request): none Refill authorized: please approve if appropriate

## 2023-06-10 ENCOUNTER — Encounter: Payer: Self-pay | Admitting: Dermatology

## 2023-06-10 ENCOUNTER — Ambulatory Visit: Admitting: Dermatology

## 2023-06-10 VITALS — BP 125/81

## 2023-06-10 DIAGNOSIS — D229 Melanocytic nevi, unspecified: Secondary | ICD-10-CM

## 2023-06-10 DIAGNOSIS — D2239 Melanocytic nevi of other parts of face: Secondary | ICD-10-CM | POA: Diagnosis not present

## 2023-06-10 MED ORDER — TRETINOIN 0.025 % EX CREA: TOPICAL_CREAM | CUTANEOUS | 0 refills | Status: AC

## 2023-06-10 NOTE — Patient Instructions (Addendum)
 Hello Darlene Montoya,  Thank you for visiting today. Here is a summary of the key instructions:  - Procedure: Three small fibrous papules on the nose were treated with electrodesication   - Apply Aquaphor daily to the treated areas until scabs fall off  - Medications: Start using tretinoin 0.025% cream after the treated areas have healed   - Use two nights a week for the first month, not on back-to-back nights   - Apply a pea-sized amount for the entire face   - Follow with moisturizer  - Skincare:   - Wash face in the morning after using tretinoin   - Apply sunscreen every morning   - Use Neutrogena mineral sunscreens, especially in [place]   Please reach out if you have any questions or concerns.  Warm regards,  Dr. Louana Roup, Dermatology       Important Information  Due to recent changes in healthcare laws, you may see results of your pathology and/or laboratory studies on MyChart before the doctors have had a chance to review them. We understand that in some cases there may be results that are confusing or concerning to you. Please understand that not all results are received at the same time and often the doctors may need to interpret multiple results in order to provide you with the best plan of care or course of treatment. Therefore, we ask that you please give us  2 business days to thoroughly review all your results before contacting the office for clarification. Should we see a critical lab result, you will be contacted sooner.   If You Need Anything After Your Visit  If you have any questions or concerns for your doctor, please call our main line at [phone] If no one answers, please leave a voicemail as directed and we will return your call as soon as possible. Messages left after 4 pm will be answered the following business day.   You may also send us  a message via MyChart. We typically respond to MyChart messages within 1-2 business days.  For  prescription refills, please ask your pharmacy to contact our office. Our fax number is [phone].  If you have an urgent issue when the clinic is closed that cannot wait until the next business day, you can page your doctor at the number below.    Please note that while we do our best to be available for urgent issues outside of office hours, we are not available 24/7.   If you have an urgent issue and are unable to reach us , you may choose to seek medical care at your doctor's office, retail clinic, urgent care center, or emergency room.  If you have a medical emergency, please immediately call 911 or go to the emergency department. In the event of inclement weather, please call our main line at [phone] for an update on the status of any delays or closures.  Dermatology Medication Tips: Please keep the boxes that topical medications come in in order to help keep track of the instructions about where and how to use these. Pharmacies typically print the medication instructions only on the boxes and not directly on the medication tubes.   If your medication is too expensive, please contact our office at [phone] or send us  a message through MyChart.   We are unable to tell what your co-pay for medications will be in advance as this is different depending on your insurance coverage. However, we may be able to find a substitute medication at  lower cost or fill out paperwork to get insurance to cover a needed medication.   If a prior authorization is required to get your medication covered by your insurance company, please allow us  1-2 business days to complete this process.  Drug prices often vary depending on where the prescription is filled and some pharmacies may offer cheaper prices.  The website www.goodrx.com contains coupons for medications through different pharmacies. The prices here do not account for what the cost may be with help from insurance (it may be cheaper with your  insurance), but the website can give you the price if you did not use any insurance.  - You can print the associated coupon and take it with your prescription to the pharmacy.  - You may also stop by our office during regular business hours and pick up a GoodRx coupon card.  - If you need your prescription sent electronically to a different pharmacy, notify our office through [facility] or by phone at [phone]

## 2023-06-10 NOTE — Progress Notes (Signed)
   Follow-Up Visit   Subjective  Darlene Montoya is a 38 y.o. female who presents for the following: Spot Check  Patient present today for follow up visit. Patient was last evaluated on 02/09/23 for SebDerm & Rosacea. Pt is being seen for new issue today She has a spot in the crease of R side of nose that has been there for 37mo that she thought was a pimple. She scratched it one day causing it to bleed a lot. She mentioned that she has not applied anything to it prior to this appointment. Patient denies medication changes.  The following portions of the chart were reviewed this encounter and updated as appropriate: medications, allergies, medical history  Review of Systems:  No other skin or systemic complaints except as noted in HPI or Assessment and Plan.  Objective  Well appearing patient in no apparent distress; mood and affect are within normal limits.   A focused examination was performed of the following areas: nose   Relevant exam findings are noted in the Assessment and Plan.         Assessment & Plan   Angiofibroma/Fibrous Papule - 1-2 mm smooth symmetric flesh colored to pink papule(s) without features suspicious for malignancy on dermoscopy located at R side of nose in crease - Benign-appearing.  Observation.  Call clinic for new or changing lesions.   - Rx Tretinoin 0.025% - apply pea size amount to the face 2x week on Tu/Th at night for the first month. In one month increase to 3x week at night on M/W.F. FIBROUS PAPULE OF SKIN (4) Left Supratip of Nose, Right [place]asi (3) Destruction of lesion - Right [place] (3) Complexity: simple   Destruction method: electrodesiccation and curettage   Informed consent: discussed and consent obtained   Timeout:  patient name, date of birth, surgical site, and procedure verified Outcome: patient tolerated procedure well with no complications   Post-procedure details: wound care instructions given     No follow-ups on  file.   Documentation: I have reviewed the above documentation for accuracy and completeness, and I agree with the above.  I, Keilen Kahl Louanne Roussel, CMA, am acting as scribe for [employer], DO.   Louana Roup, DO

## 2023-06-17 ENCOUNTER — Ambulatory Visit (INDEPENDENT_AMBULATORY_CARE_PROVIDER_SITE_OTHER): Payer: Self-pay | Admitting: Radiology

## 2023-06-17 ENCOUNTER — Encounter: Payer: Self-pay | Admitting: Radiology

## 2023-06-17 VITALS — BP 112/66 | HR 70 | Ht 62.75 in | Wt 132.2 lb

## 2023-06-17 DIAGNOSIS — Z1331 Encounter for screening for depression: Secondary | ICD-10-CM | POA: Diagnosis not present

## 2023-06-17 DIAGNOSIS — Z01419 Encounter for gynecological examination (general) (routine) without abnormal findings: Secondary | ICD-10-CM | POA: Diagnosis not present

## 2023-06-17 DIAGNOSIS — Z309 Encounter for contraceptive management, unspecified: Secondary | ICD-10-CM

## 2023-06-17 MED ORDER — NORETHIN ACE-ETH ESTRAD-FE 1-20 MG-MCG PO TABS
1.0000 | ORAL_TABLET | Freq: Every day | ORAL | 4 refills | Status: DC
Start: 2023-06-17 — End: 2023-07-28

## 2023-06-17 NOTE — Patient Instructions (Signed)

## 2023-06-17 NOTE — Progress Notes (Signed)
 Darlene Montoya [birth year] [id number]   History:  38 y.o. G0 presents for annual exam. Doing well on OCPs for cycle control. Occasional vaginal dryness managed with coconut oil.  Gynecologic History Patient's last menstrual period was 06/01/2023. Period Cycle (Days): 28 (skipped last period, had menstrual symptoms but no flow) Period Duration (Days): 5 Period Pattern: Regular Menstrual Flow: Light Menstrual Control:  (period underwear) Dysmenorrhea: (!) Mild Dysmenorrhea Symptoms: Cramping Contraception/Family planning: female partner/OCPs Sexually active: yes Last Pap: 2023. Results were: normal   Obstetric History OB History  Gravida Para Term Preterm AB Living  0 0 0 0 0 0  SAB IAB Ectopic Multiple Live Births  0 0 0 0 0       06/17/2023    9:58 AM 01/06/2018   10:32 AM  Depression screen PHQ 2/9  Decreased Interest 0 0  Down, Depressed, Hopeless 0 0  PHQ - 2 Score 0 0     The following portions of the patient's history were reviewed and updated as appropriate: allergies, current medications, past family history, past medical history, past social history, past surgical history, and problem list.  Review of Systems  All other systems reviewed and are negative.   Past medical history, past surgical history, family history and social history were all reviewed and documented in the EPIC chart.  Exam:  Vitals:   06/17/23 0958  BP: 112/66  Pulse: 70  SpO2: 99%  Weight: 132 lb 3.2 oz (60 kg)  Height: 5' 2.75" (1.594 m)   Body mass index is 23.61 kg/m.  Physical Exam Vitals and nursing note reviewed. Exam conducted with a chaperone present.  Constitutional:      Appearance: Normal appearance. She is normal weight.  HENT:     Head: Normocephalic and atraumatic.  Neck:     Thyroid: No thyroid mass, thyromegaly or thyroid tenderness.  Cardiovascular:     Rate and Rhythm: Regular rhythm.     Heart sounds: Normal heart sounds.  Pulmonary:     Effort:  Pulmonary effort is normal.     Breath sounds: Normal breath sounds.  Chest:  Breasts:    Breasts are symmetrical.     Right: Normal. No inverted nipple, mass, nipple discharge, skin change or tenderness.     Left: Normal. No inverted nipple, mass, nipple discharge, skin change or tenderness.  Abdominal:     General: Abdomen is flat. Bowel sounds are normal.     Palpations: Abdomen is soft.  Genitourinary:    General: Normal vulva.     Vagina: Normal. No vaginal discharge, bleeding or lesions.     Cervix: Normal. No discharge or lesion.     Uterus: Normal. Not enlarged and not tender.      Adnexa: Right adnexa normal and left adnexa normal.       Right: No mass, tenderness or fullness.         Left: No mass, tenderness or fullness.    Lymphadenopathy:     Upper Body:     Right upper body: No axillary adenopathy.     Left upper body: No axillary adenopathy.  Skin:    General: Skin is warm and dry.  Neurological:     Mental Status: She is alert and oriented to person, place, and time.  Psychiatric:        Mood and Affect: Mood normal.        Thought Content: Thought content normal.        Judgment: Judgment normal.  Ellis Guys, CMA present for exam  Assessment/Plan:   1. Well woman exam with routine gynecological exam (Primary) Pap due 2026  2. Encounter for contraceptive management, unspecified type - norethindrone-ethinyl estradiol-FE (BLISOVI FE 1/20) 1-20 MG-MCG tablet; Take 1 tablet by mouth daily.  Dispense: 84 tablet; Refill: 4  3. Depression screening negative     Return in about 1 year (around 06/16/2024) for Annual.  Synetta Eves B WHNP-BC 10:28 AM 06/17/2023

## 2023-07-25 ENCOUNTER — Other Ambulatory Visit: Payer: Self-pay | Admitting: Radiology

## 2023-07-25 DIAGNOSIS — Z309 Encounter for contraceptive management, unspecified: Secondary | ICD-10-CM

## 2023-07-27 NOTE — Telephone Encounter (Signed)
 Med refill request:Blisovi Fe 1/20 Last AEX: 06/17/23 JC Next AEX: 06/17/24 JC Last MMG (if hormonal med) n/a Refill authorized: Last Rx sent #[id number] with 4 refills on 06/17/23 JC. Please approve or deny.

## 2023-08-04 ENCOUNTER — Ambulatory Visit: Payer: 59 | Admitting: Dermatology

## 2023-09-14 ENCOUNTER — Ambulatory Visit: Admitting: Dermatology

## 2024-06-17 ENCOUNTER — Ambulatory Visit: Admitting: Radiology
